# Patient Record
Sex: Female | Born: 1955 | Race: White | Hispanic: No | Smoking: Never smoker
Health system: Southern US, Community
[De-identification: ages and names within clinical notes are randomized; demographics above are authoritative.]

---

## 2014-08-25 ENCOUNTER — Other Ambulatory Visit: Payer: Self-pay | Admitting: Specialist

## 2014-08-25 DIAGNOSIS — E240 Pituitary-dependent Cushing's disease: Secondary | ICD-10-CM

## 2014-08-31 ENCOUNTER — Encounter: Payer: Self-pay | Admitting: Endocrinology

## 2014-08-31 ENCOUNTER — Ambulatory Visit (INDEPENDENT_AMBULATORY_CARE_PROVIDER_SITE_OTHER): Payer: BC Managed Care – PPO | Admitting: Endocrinology

## 2014-08-31 VITALS — BP 138/82 | HR 98 | Temp 98.8°F | Resp 16 | Ht 66.75 in | Wt 184.0 lb

## 2014-08-31 DIAGNOSIS — E270 Other adrenocortical overactivity: Secondary | ICD-10-CM

## 2014-08-31 MED ORDER — DEXAMETHASONE 0.5 MG PO TABS: 0.5000 mg | ORAL_TABLET | Freq: Four times a day (QID) | ORAL | Status: DC

## 2014-08-31 NOTE — Patient Instructions (Signed)
Let's check a 24-HR urine for cortisone. Then please take dexamethasone 4 times a day, x 8 doses: Day 1: afternoon and bedtime.   Day 2: 4 times a day.  Day 3: morning and lunch.  Day 3: blood test at 4 PM.   We'll let you know about the results.

## 2014-08-31 NOTE — Progress Notes (Signed)
Subjective:    Patient ID: Joyce Marshall, female    DOB: 03/30/1955, 59 y.o.   MRN: [id number]  HPI Referred by Dr Neale BurlyFreeman.  Pt states 9 months of severe weight gain, worst at the trunk of the body, and assoc easy bruising.  She was found to have high afternoon cortisol level.  She takes no steroid medication.   No past medical history on file.  No past surgical history on file.  History   Social History  . Marital Status: Divorced    Spouse Name: N/A  . Number of Children: N/A  . Years of Education: N/A   Occupational History  . Not on file.   Social History Main Topics  . Smoking status: Never Smoker   . Smokeless tobacco: Not on file  . Alcohol Use: Not on file  . Drug Use: Not on file  . Sexual Activity: Not on file   Other Topics Concern  . Not on file   Social History Narrative  . No narrative on file    No current outpatient prescriptions on file prior to visit.   No current facility-administered medications on file prior to visit.    Not on File  No family history on file.  BP 138/82 mmHg  Pulse 98  Temp(Src) 98.8 F (37.1 C) (Oral)  Resp 16  Ht 5' 6.75" (1.695 m)  Wt 184 lb (83.462 kg)  BMI 29.05 kg/m2  SpO2 98%  Review of Systems denies fever, menopausal sxs, hirsutism, sob, hyperpigmentation, cramps, numbness, and rash on the abdomen.  She has chronic headache.  She has facial flushing, excessive diaphoresis, depression, urinary frequency, muscle weakness, leg cramps, and alopecia.      Objective:   Physical Exam VS: see vs page GEN: no distress HEAD: head: no deformity eyes: no periorbital swelling, no proptosis external nose and ears are normal mouth: no lesion seen NECK: supple, thyroid is not enlarged CHEST WALL: no deformity LUNGS:  Clear to auscultation CV: reg rate and rhythm, no murmur ABD: abdomen is soft, nontender.  no hepatosplenomegaly.  not distended.  no hernia MUSCULOSKELETAL: muscle bulk and strength are grossly normal.   no obvious joint swelling.  gait is normal and steady.  Upper dorsal spine is not prominent.   EXTEMITIES: no deformity.  no ulcer on the feet.  feet are of normal color and temp.  no edema PULSES: dorsalis pedis intact bilat.  no carotid bruit NEURO:  cn 2-12 grossly intact.   readily moves all 4's.  sensation is intact to touch on the feet SKIN:  Normal texture and temperature.  No rash or suspicious lesion is visible.  No striae.   NODES:  None palpable at the neck.   PSYCH: alert, well-oriented.  Does not appear anxious nor depressed.   outside test results are reviewed: Afternoon cortisol=26 Simultaneous ACTH=30.   i have reviewed office records: pt was seen for headache, and MRI was ordered.       Assessment & Plan:  Hypercortisolism, new, uncertain etiology.  i'll await MRI result, but our next step is further biochemical evaluation.     Patient is advised the following: Patient Instructions  Let's check a 24-HR urine for cortisone. Then please take dexamethasone 4 times a day, x 8 doses: Day 1: afternoon and bedtime.   Day 2: 4 times a day.  Day 3: morning and lunch.  Day 3: blood test at 4 PM.   We'll let you know about the results.

## 2014-09-05 ENCOUNTER — Ambulatory Visit
Admission: RE | Admit: 2014-09-05 | Discharge: 2014-09-05 | Disposition: A | Discharge status: Home / Self Care | Payer: BC Managed Care – PPO | Source: Ambulatory Visit | Attending: Specialist | Admitting: Specialist

## 2014-09-05 ENCOUNTER — Other Ambulatory Visit: Payer: BC Managed Care – PPO

## 2014-09-05 DIAGNOSIS — E240 Pituitary-dependent Cushing's disease: Secondary | ICD-10-CM

## 2014-09-05 MED ORDER — GADOBENATE DIMEGLUMINE 529 MG/ML IV SOLN
10.0000 mL | Freq: Once | INTRAVENOUS | Status: AC | PRN
  Administered 2014-09-05: 10 mL via INTRAVENOUS

## 2014-09-07 ENCOUNTER — Other Ambulatory Visit (INDEPENDENT_AMBULATORY_CARE_PROVIDER_SITE_OTHER): Payer: BC Managed Care – PPO

## 2014-09-07 DIAGNOSIS — E270 Other adrenocortical overactivity: Secondary | ICD-10-CM

## 2014-09-07 LAB — TSH: TSH: 5.41 u[IU]/mL — ABNORMAL HIGH (ref 0.35–4.50)

## 2014-09-07 LAB — T4, FREE: Free T4: 0.9 ng/dL (ref 0.60–1.60)

## 2014-09-08 ENCOUNTER — Other Ambulatory Visit (INDEPENDENT_AMBULATORY_CARE_PROVIDER_SITE_OTHER): Payer: BC Managed Care – PPO

## 2014-09-08 DIAGNOSIS — E270 Other adrenocortical overactivity: Secondary | ICD-10-CM | POA: Diagnosis not present

## 2014-09-08 LAB — CORTISOL, URINE, 24 HOUR
Cortisol (Ur), Free: 15.4 mcg/24 h (ref 4.0–50.0)
RESULTS RECEIVED: 1.25 g/(24.h) (ref 0.63–2.50)

## 2014-09-08 LAB — LUTEINIZING HORMONE: LH: 26.79 m[IU]/mL

## 2014-09-08 LAB — CORTISOL: Cortisol, Plasma: 1.1 ug/dL

## 2014-09-11 ENCOUNTER — Encounter (INDEPENDENT_AMBULATORY_CARE_PROVIDER_SITE_OTHER): Payer: Self-pay

## 2014-09-11 ENCOUNTER — Telehealth: Payer: Self-pay

## 2014-09-11 ENCOUNTER — Other Ambulatory Visit: Payer: Self-pay | Admitting: Endocrinology

## 2014-09-11 LAB — ACTH

## 2014-09-11 MED ORDER — LEVOTHYROXINE SODIUM 25 MCG PO TABS: 25.0000 ug | ORAL_TABLET | Freq: Every day | ORAL | Status: AC

## 2014-09-11 NOTE — Telephone Encounter (Signed)
Made in error

## 2014-09-14 ENCOUNTER — Telehealth: Payer: Self-pay | Admitting: Endocrinology

## 2014-09-14 DIAGNOSIS — E039 Hypothyroidism, unspecified: Secondary | ICD-10-CM | POA: Insufficient documentation

## 2014-09-14 NOTE — Telephone Encounter (Signed)
please call patient: Your cortisone is normal, so this needs no further testing. The next step is to recheck the thyroid blood test in approx 1 month. i have requested it to be done here, but you can do somewhere else if you wish i hope the thyroid pill helps you feel better.

## 2014-09-14 NOTE — Telephone Encounter (Signed)
Pt is aware of this information and she has set up a 1 month f/u appt.

## 2014-09-14 NOTE — Telephone Encounter (Signed)
Pt would like to know where she is going now that the urine came back normal, what are her next steps?

## 2014-09-14 NOTE — Telephone Encounter (Signed)
Pt would like to know what her next steps are in treatment.Please advise

## 2014-10-16 ENCOUNTER — Encounter: Payer: Self-pay | Admitting: Endocrinology

## 2014-10-16 ENCOUNTER — Ambulatory Visit (INDEPENDENT_AMBULATORY_CARE_PROVIDER_SITE_OTHER): Payer: BC Managed Care – PPO | Admitting: Endocrinology

## 2014-10-16 VITALS — BP 136/88 | HR 82 | Temp 98.5°F | Ht 66.75 in | Wt 179.0 lb

## 2014-10-16 DIAGNOSIS — E039 Hypothyroidism, unspecified: Secondary | ICD-10-CM | POA: Diagnosis not present

## 2014-10-16 LAB — T4, FREE: Free T4: 0.87 ng/dL (ref 0.60–1.60)

## 2014-10-16 LAB — TSH: TSH: 2.51 u[IU]/mL (ref 0.35–4.50)

## 2014-10-16 NOTE — Patient Instructions (Addendum)
blood tests are requested for you today.  We'll let you know about the results.   I would be happy to see you back here whenever necessary.

## 2014-10-16 NOTE — Progress Notes (Signed)
   Subjective:    Patient ID: Joyce Marshall, female    DOB: May 15, 1955, 59 y.o.   MRN: [id number]  HPI Pt returns for f/u of hypothyroidism (dx'ed 2016; hypercortisolism was also suspected, but 2-day dec supp test was normal); she takes synthroid as rx'ed.  Chief complaint is severe pain throughout the body, and assoc easy bruising.   No past medical history on file.  No past surgical history on file.  Social History   Social History  . Marital Status: Divorced    Spouse Name: N/A  . Number of Children: N/A  . Years of Education: N/A   Occupational History  . Not on file.   Social History Main Topics  . Smoking status: Never Smoker   . Smokeless tobacco: Not on file  . Alcohol Use: Not on file  . Drug Use: Not on file  . Sexual Activity: Not on file   Other Topics Concern  . Not on file   Social History Narrative    Current Outpatient Prescriptions on File Prior to Visit  Medication Sig Dispense Refill  . DULoxetine (CYMBALTA) 60 MG capsule Take 60 mg by mouth 2 (two) times daily.    . hydrochlorothiazide (HYDRODIURIL) 50 MG tablet Take 50 mg by mouth daily.    Marland Kitchen levothyroxine (SYNTHROID, LEVOTHROID) 25 MCG tablet Take 1 tablet (25 mcg total) by mouth daily before breakfast. 30 tablet 11  . metoprolol succinate (TOPROL-XL) 50 MG 24 hr tablet Take 50 mg by mouth 2 (two) times daily. Take with or immediately following a meal.    . modafinil (PROVIGIL) 200 MG tablet Take 200 mg by mouth 2 (two) times daily.    Marland Kitchen omeprazole (PRILOSEC) 20 MG capsule Take 20 mg by mouth 2 (two) times daily before a meal.    . topiramate (TOPAMAX) 50 MG tablet Take 50 mg by mouth daily.     No current facility-administered medications on file prior to visit.    Not on File  No family history on file.  BP 136/88 mmHg  Pulse 82  Temp(Src) 98.5 F (36.9 C) (Oral)  Ht 5' 6.75" (1.695 m)  Wt 179 lb (81.194 kg)  BMI 28.26 kg/m2  SpO2 97%  Review of Systems Denies edema.  She has lost a few  lbs.      Objective:   Physical Exam VITAL SIGNS:  See vs page GENERAL: no distress NECK: There is no palpable thyroid enlargement.  No thyroid nodule is palpable.  No palpable lymphadenopathy at the anterior neck. Skin: not diaphoretic Neuro: no tremor   Lab Results  Component Value Date   TSH 2.51 10/16/2014      Assessment & Plan:  Hyperthyroidism, well-replaced.  sxs are of non-thyroidal origin  Please continue the same synthroid. Ret here prn.

## 2016-04-27 IMAGING — MR MR HEAD WO/W CM
13 of 19 series · 29 of 48 positions shown · IV contrast (16ml multihance)
Comparison: None.

CLINICAL DATA: Pituitary Cushing disease.  Headache.

BUN and creatinine were obtained on site at [HOSPITAL] at
[HOSPITAL].
Results:  BUN 22 mg/dL,  Creatinine 1.0 mg/dL.
EXAM:
MRI HEAD WITHOUT AND WITH CONTRAST
TECHNIQUE: Multiplanar, multiecho pulse sequences of the brain and surrounding
structures were obtained without and with intravenous contrast.
CONTRAST:  10mL MULTIHANCE GADOBENATE DIMEGLUMINE 529 MG/ML IV SOLN

[Series 2: T1 · sagittal · 5.0mm · 0.45mm/px · 1 of 19 slices shown]
[im 1/19]
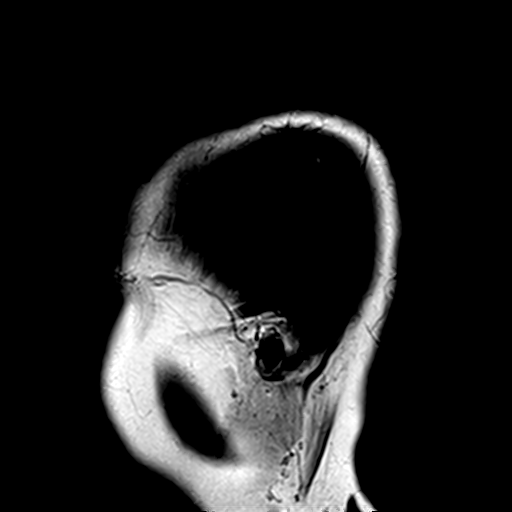

[Series 3: T2 · axial · 5.0mm · 0.36mm/px · z∈[-59,+77]mm · 2 of 22 slices shown]
[im 1/22]
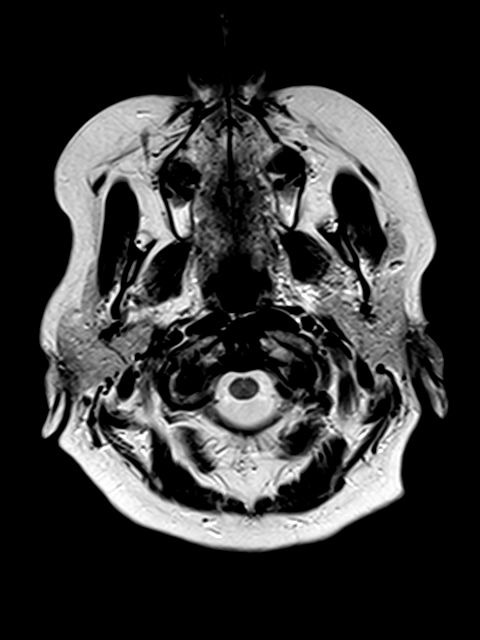
[im 22/22]
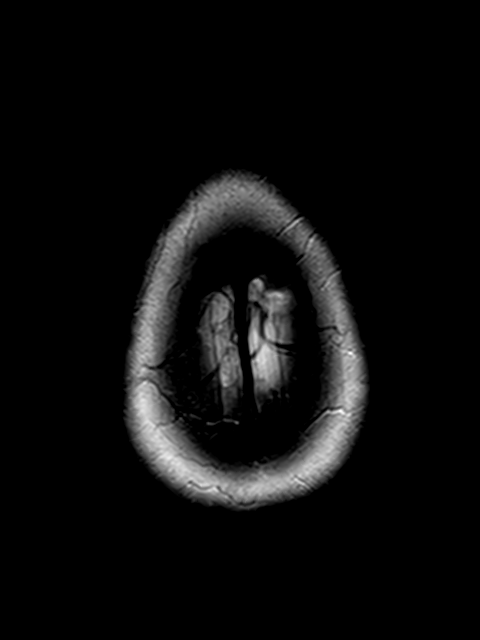

[Series 6: DWI · axial · 3.0mm · 0.90mm/px · z∈[-63,+77]mm · 8 of 80 slices shown]
[im 1/80]
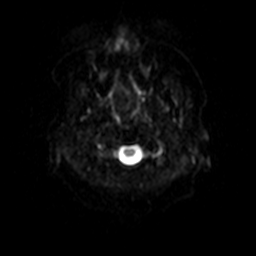
[im 9/80]
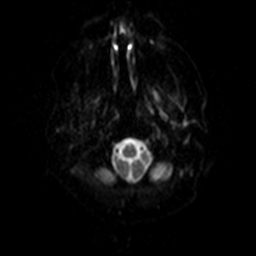
[im 27/80]
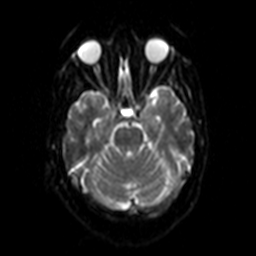
[im 36/80]
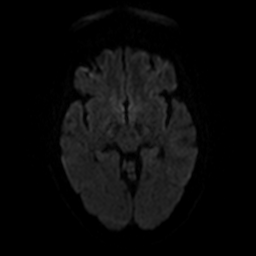
[im 44/80]
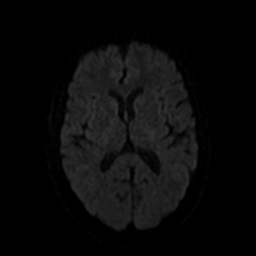
[im 53/80]
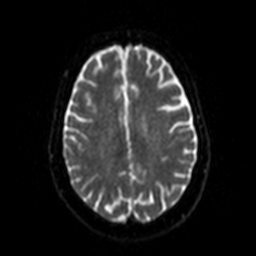
[im 71/80]
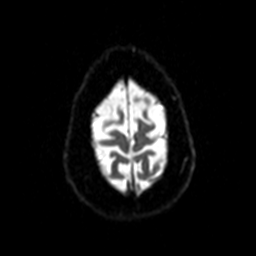
[im 80/80]
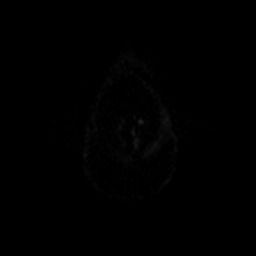

[Series 7: dwi_adc · axial · 3.0mm · 0.90mm/px · z∈[-63,+77]mm · 5 of 40 slices shown]
[im 1/40]
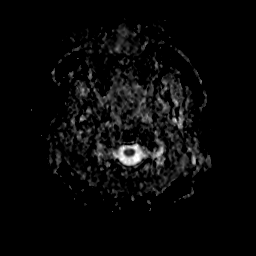
[im 10/40]
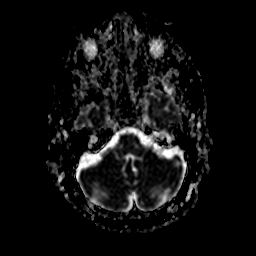
[im 20/40]
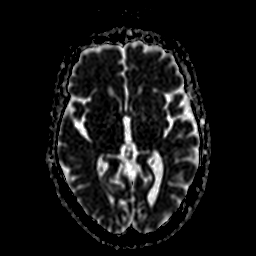
[im 30/40]
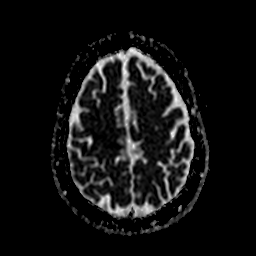
[im 40/40]
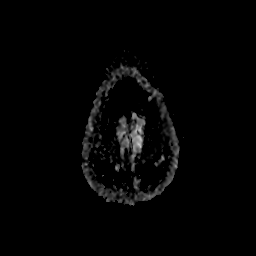

[Series 8: sag 3mm · sagittal · 3.0mm · 0.31mm/px · 1 of 11 slices shown]
[im 1/11]
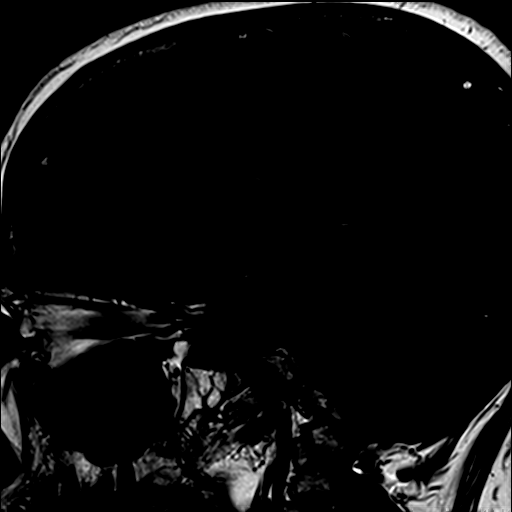

[Series 9: FLAIR · axial · 5.0mm · 0.45mm/px · z∈[-59,+77]mm · 3 of 22 slices shown]
[im 1/22]
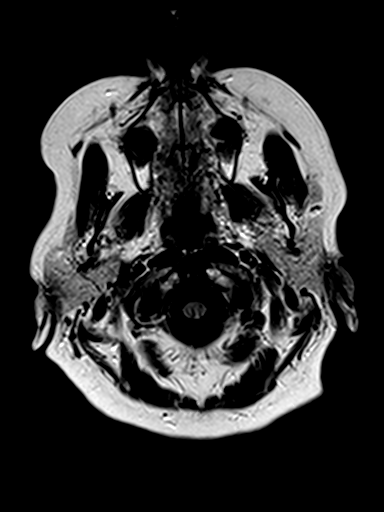
[im 11/22]
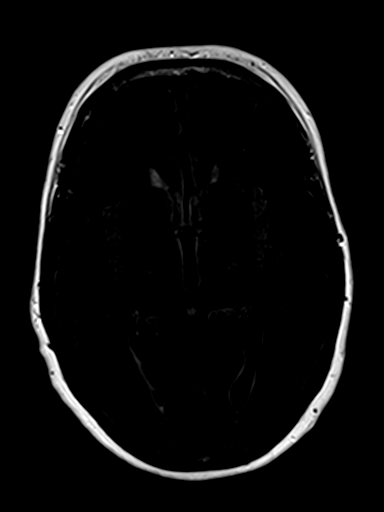
[im 22/22]
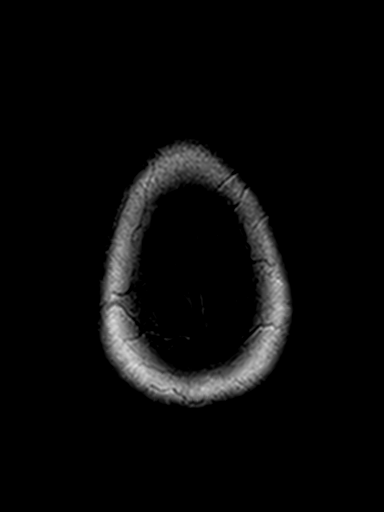

[Series 10: cor 3mm · coronal · 3.0mm · 0.31mm/px · 1 of 11 slices shown]
[im 1/11]
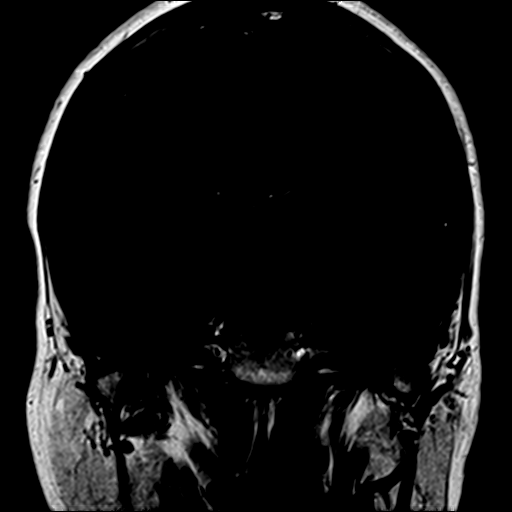

[Series 11: axial grad (blood) · axial · 5.0mm · 0.49mm/px · z∈[-62,+74]mm · 3 of 22 slices shown]
[im 1/22]
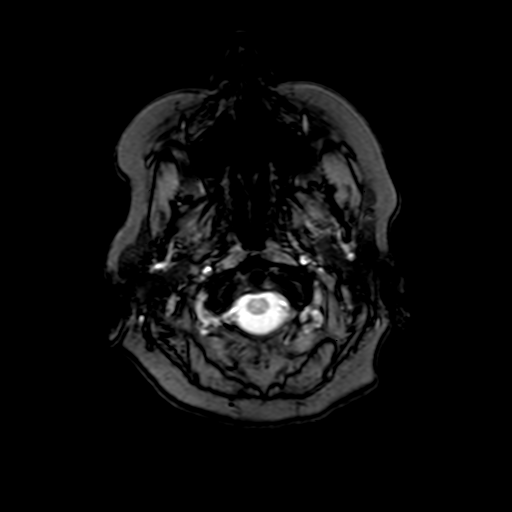
[im 11/22]
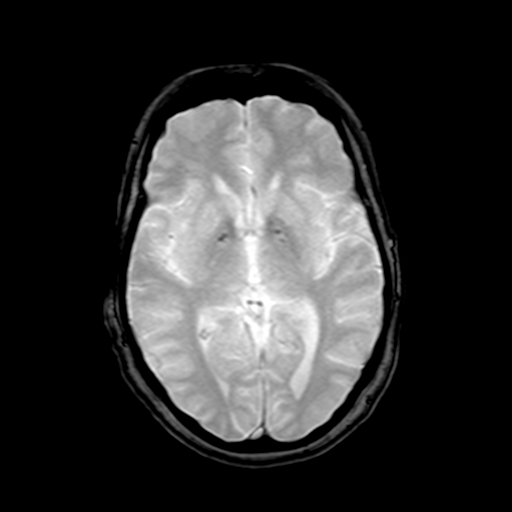
[im 22/22]
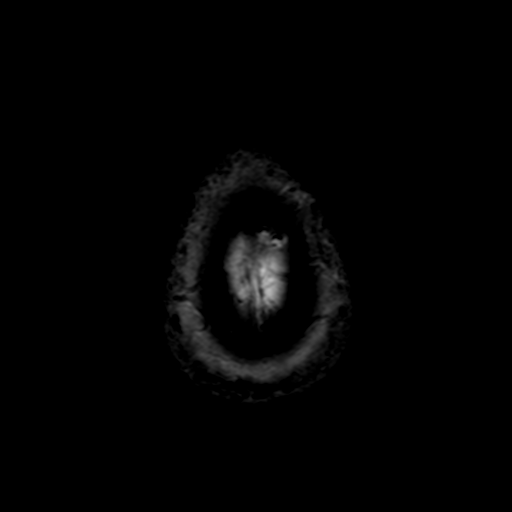

[Series 12: pre fs cor · coronal · non-contrast · 3.0mm · 0.35mm/px · 1 of 7 slices shown]
[im 1/7]
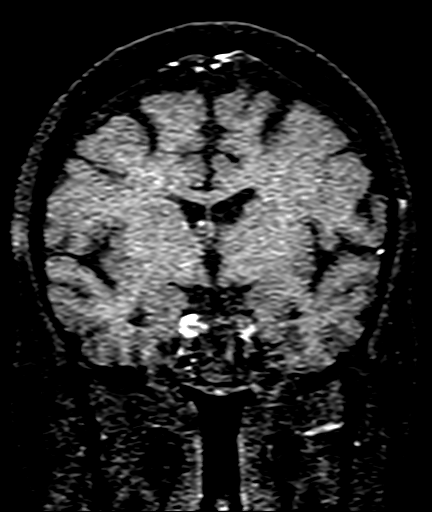

[Series 13: post fs cor · coronal · 3.0mm · 0.35mm/px · 1 of 7 slices shown (1 of 4)]
[im 1/7]
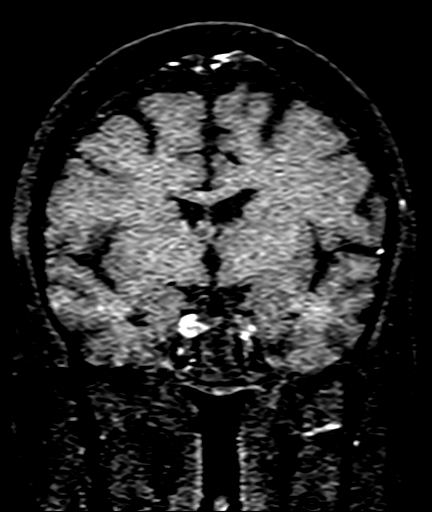

[Series 14: post fs cor · coronal · 3.0mm · 0.35mm/px · 1 of 7 slices shown (2 of 4)]
[im 1/7]
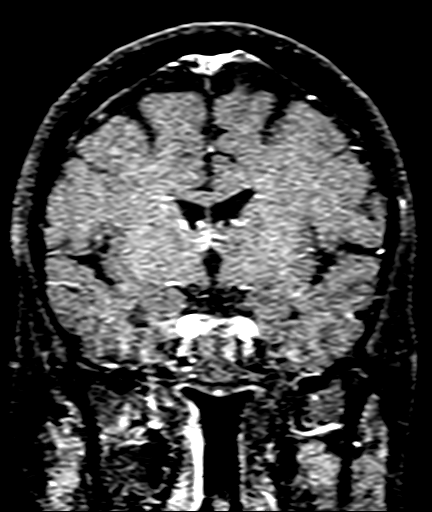

[Series 15: post fs cor · coronal · 3.0mm · 0.35mm/px · 1 of 7 slices shown (3 of 4)]
[im 1/7]
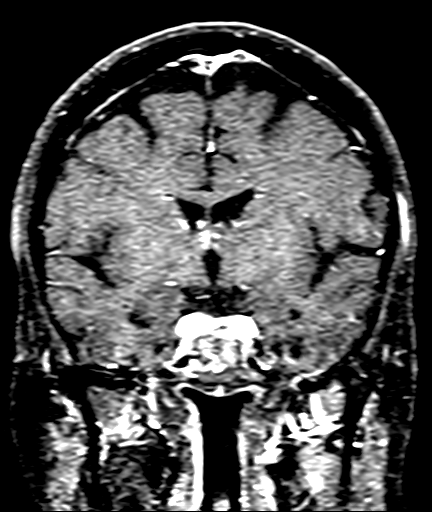

[Series 16: post fs cor · coronal · 3.0mm · 0.35mm/px · 1 of 7 slices shown (4 of 4)]
[im 1/7]
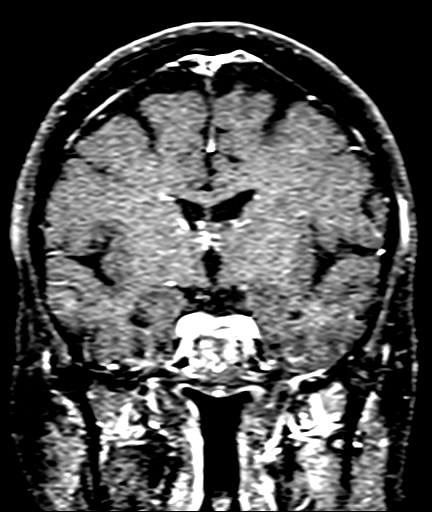

[29 of 48 positions shown; findings below may reference images not displayed]

FINDINGS: Dedicated dynamic pituitary imaging was performed.

Pituitary normal in size measuring 5.3 mm in height. Pituitary
enhances homogeneously without microadenoma. Infundibulum midline.
No compression optic chiasm. Cavernous sinus normal bilaterally.

Ventricle size is normal.  Negative for Chiari malformation.

Negative for acute infarct. Scattered small hyperintensities in the
cerebral white matter bilaterally consistent with mild chronic
microvascular ischemia.

Negative for hemorrhage or mass.

Postcontrast imaging of the entire brain reveals normal enhancement.
IMPRESSION: Normal pituitary.

Mild chronic microvascular ischemic change in the white matter. No
acute infarct or mass.

## 2018-07-26 DEATH — deceased

## 2018-08-25 DEATH — deceased
